# Patient Record
Sex: Male | Born: 1940 | Race: White | Hispanic: No | Marital: Married | Smoking: Never smoker
Health system: Southern US, Community
[De-identification: ages and names within clinical notes are randomized; demographics above are authoritative.]

## PROBLEM LIST (undated history)

## (undated) DIAGNOSIS — T4145XA Adverse effect of unspecified anesthetic, initial encounter: Secondary | ICD-10-CM

## (undated) DIAGNOSIS — G4700 Insomnia, unspecified: Secondary | ICD-10-CM

## (undated) DIAGNOSIS — G4730 Sleep apnea, unspecified: Secondary | ICD-10-CM

## (undated) DIAGNOSIS — R42 Dizziness and giddiness: Secondary | ICD-10-CM

## (undated) DIAGNOSIS — J189 Pneumonia, unspecified organism: Secondary | ICD-10-CM

## (undated) DIAGNOSIS — I639 Cerebral infarction, unspecified: Secondary | ICD-10-CM

## (undated) DIAGNOSIS — IMO0001 Reserved for inherently not codable concepts without codable children: Secondary | ICD-10-CM

## (undated) DIAGNOSIS — H9191 Unspecified hearing loss, right ear: Secondary | ICD-10-CM

## (undated) DIAGNOSIS — M1990 Unspecified osteoarthritis, unspecified site: Secondary | ICD-10-CM

## (undated) DIAGNOSIS — T8859XA Other complications of anesthesia, initial encounter: Secondary | ICD-10-CM

## (undated) DIAGNOSIS — E119 Type 2 diabetes mellitus without complications: Secondary | ICD-10-CM

## (undated) DIAGNOSIS — I10 Essential (primary) hypertension: Secondary | ICD-10-CM

## (undated) DIAGNOSIS — E041 Nontoxic single thyroid nodule: Secondary | ICD-10-CM

## (undated) HISTORY — PX: EYE SURGERY: SHX253

## (undated) HISTORY — PX: NOSE SURGERY: SHX723

## (undated) HISTORY — PX: ROTATOR CUFF REPAIR: SHX139

## (undated) HISTORY — PX: HERNIA REPAIR: SHX51

## (undated) HISTORY — PX: LIPOMA EXCISION: SHX5283

## (undated) HISTORY — PX: COLONOSCOPY: SHX174

## (undated) HISTORY — PX: BASAL CELL CARCINOMA EXCISION: SHX1214

## (undated) HISTORY — PX: MELANOMA EXCISION: SHX5266

---
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
## 1971-01-12 HISTORY — PX: TONSILLECTOMY: SUR1361

## 2002-01-11 DIAGNOSIS — I639 Cerebral infarction, unspecified: Secondary | ICD-10-CM

## 2002-01-11 HISTORY — DX: Cerebral infarction, unspecified: I63.9

## 2006-01-11 HISTORY — PX: CHOLECYSTECTOMY: SHX55

## 2008-01-12 HISTORY — PX: GREEN LIGHT LASER TURP (TRANSURETHRAL RESECTION OF PROSTATE: SHX6260

## 2011-01-12 HISTORY — PX: CARPAL TUNNEL RELEASE: SHX101

## 2015-02-03 ENCOUNTER — Other Ambulatory Visit (HOSPITAL_COMMUNITY): Payer: Self-pay | Admitting: Neurosurgery

## 2015-03-04 ENCOUNTER — Other Ambulatory Visit: Payer: Self-pay | Admitting: Neurosurgery

## 2015-03-05 ENCOUNTER — Other Ambulatory Visit: Payer: Self-pay | Admitting: Neurosurgery

## 2015-03-07 ENCOUNTER — Encounter (HOSPITAL_COMMUNITY)
Admission: RE | Admit: 2015-03-07 | Discharge: 2015-03-07 | Disposition: A | Discharge status: Home / Self Care | Payer: Medicare Other | Source: Ambulatory Visit | Attending: Neurosurgery | Admitting: Neurosurgery

## 2015-03-07 ENCOUNTER — Encounter (HOSPITAL_COMMUNITY): Payer: Self-pay

## 2015-03-07 DIAGNOSIS — Z8673 Personal history of transient ischemic attack (TIA), and cerebral infarction without residual deficits: Secondary | ICD-10-CM | POA: Diagnosis not present

## 2015-03-07 DIAGNOSIS — Z01818 Encounter for other preprocedural examination: Secondary | ICD-10-CM | POA: Diagnosis not present

## 2015-03-07 DIAGNOSIS — G4733 Obstructive sleep apnea (adult) (pediatric): Secondary | ICD-10-CM | POA: Diagnosis not present

## 2015-03-07 DIAGNOSIS — E119 Type 2 diabetes mellitus without complications: Secondary | ICD-10-CM | POA: Diagnosis not present

## 2015-03-07 DIAGNOSIS — R9431 Abnormal electrocardiogram [ECG] [EKG]: Secondary | ICD-10-CM | POA: Insufficient documentation

## 2015-03-07 DIAGNOSIS — M4806 Spinal stenosis, lumbar region: Secondary | ICD-10-CM | POA: Diagnosis not present

## 2015-03-07 DIAGNOSIS — I10 Essential (primary) hypertension: Secondary | ICD-10-CM | POA: Diagnosis not present

## 2015-03-07 DIAGNOSIS — Z79899 Other long term (current) drug therapy: Secondary | ICD-10-CM | POA: Diagnosis not present

## 2015-03-07 DIAGNOSIS — Z01812 Encounter for preprocedural laboratory examination: Secondary | ICD-10-CM | POA: Diagnosis not present

## 2015-03-07 HISTORY — DX: Adverse effect of unspecified anesthetic, initial encounter: T41.45XA

## 2015-03-07 HISTORY — DX: Essential (primary) hypertension: I10

## 2015-03-07 HISTORY — DX: Dizziness and giddiness: R42

## 2015-03-07 HISTORY — DX: Nontoxic single thyroid nodule: E04.1

## 2015-03-07 HISTORY — DX: Pneumonia, unspecified organism: J18.9

## 2015-03-07 HISTORY — DX: Unspecified hearing loss, right ear: H91.91

## 2015-03-07 HISTORY — DX: Type 2 diabetes mellitus without complications: E11.9

## 2015-03-07 HISTORY — DX: Cerebral infarction, unspecified: I63.9

## 2015-03-07 HISTORY — DX: Other complications of anesthesia, initial encounter: T88.59XA

## 2015-03-07 HISTORY — DX: Insomnia, unspecified: G47.00

## 2015-03-07 HISTORY — DX: Sleep apnea, unspecified: G47.30

## 2015-03-07 HISTORY — DX: Reserved for inherently not codable concepts without codable children: IMO0001

## 2015-03-07 HISTORY — DX: Unspecified osteoarthritis, unspecified site: M19.90

## 2015-03-07 LAB — SURGICAL PCR SCREEN
MRSA, PCR: NEGATIVE
Staphylococcus aureus: NEGATIVE

## 2015-03-07 LAB — CBC
HCT: 40.9 % (ref 39.0–52.0)
Hemoglobin: 14.2 g/dL (ref 13.0–17.0)
MCH: 32.1 pg (ref 26.0–34.0)
MCHC: 34.7 g/dL (ref 30.0–36.0)
MCV: 92.5 fL (ref 78.0–100.0)
Platelets: 193 10*3/uL (ref 150–400)
RBC: 4.42 MIL/uL (ref 4.22–5.81)
RDW: 14 % (ref 11.5–15.5)
WBC: 6.5 10*3/uL (ref 4.0–10.5)

## 2015-03-07 LAB — BASIC METABOLIC PANEL
Anion gap: 10 (ref 5–15)
BUN: 14 mg/dL (ref 6–20)
CO2: 27 mmol/L (ref 22–32)
Calcium: 9.8 mg/dL (ref 8.9–10.3)
Chloride: 103 mmol/L (ref 101–111)
Creatinine, Ser: 1.23 mg/dL (ref 0.61–1.24)
GFR calc Af Amer: >60 mL/min (ref >60)
GFR calc non Af Amer: 56 mL/min — ABNORMAL LOW (ref >60)
Glucose, Bld: 215 mg/dL — ABNORMAL HIGH (ref 65–99)
Potassium: 3.6 mmol/L (ref 3.5–5.1)
Sodium: 140 mmol/L (ref 135–145)

## 2015-03-07 NOTE — Progress Notes (Signed)
Nurse called Erie Noe and inquired about the three sets of orders that were in EPIC. Erie Noe stated that Dr. Newell Coral would be doing patients surgery, and to use the latest set of orders that included the Gentamycin in the orders. Nurse thanked Erie Noe for clarification. Call ended.

## 2015-03-07 NOTE — Pre-Procedure Instructions (Signed)
Alan Shields  03/07/2015     Your procedure is scheduled on : Monday March 17, 2015 at 7:30 AM.  Report to [facility] Admitting at 5:30 AM.  Call this number if you have problems the morning of surgery: [phone]    Remember:  Do not eat food or drink liquids after midnight.  Take these medicines the morning of surgery with A SIP OF WATER : Finasteride (Proscar), Hydrocodone if needed, Oxybutynin (Ditropan)   Stop taking any vitamins, herbal medications/supplements, Nabumetone/Relafen, Ibuprofen, Advil, Motrin, Aleve, etc on Monday February 27th   Do not wear jewelry.  Do not wear lotions, powders, or cologne.   Men may shave face and neck.  Do not bring valuables to the hospital.  [facility] is not responsible for any belongings or valuables.  Contacts, dentures or bridgework may not be worn into surgery.  Leave your suitcase in the car.  After surgery it may be brought to your room.  For patients admitted to the hospital, discharge time will be determined by your treatment team.  Patients discharged the day of surgery will not be allowed to drive home.   Name and phone number of your driver:    Special instructions:  Shower using CHG soap the night before and the morning of your surgery  Please read over the following fact sheets that you were given. Pain Booklet, Coughing and Deep Breathing, MRSA Information and Surgical Site Infection Prevention

## 2015-03-07 NOTE — Progress Notes (Signed)
PCP Launa Flight in [place]  Patient has a Development worker, international aid but is unaware of who it is.  Patient informed Nurse that he had a stress test and cardiac cath at [facility]. Will request records  Patient denied having any acute cardiac or pulmonary issues

## 2015-03-10 NOTE — Progress Notes (Addendum)
Anesthesia Chart Review:  Pt is a 75 year old male scheduled for L3-4, L4-5 laminectomy on 03/17/2015 with Dr. Newell Coral.   PCP is Dr. Launa Flight in [place], [place] who is aware of upcoming surgery.   PMH includes:  HTN, DM (diet controlled), stroke (2004), OSA (no CPAP), thyroid nodule. Never smoker. BMI 51.   Medications include: hctz, irbesartan, seroquel  Preoperative labs reviewed.  Glucose 215. HgbA1c from PCP's office was 6.5 on 03/10/15  EKG 03/07/15: Sinus rhythm with 1st degree A-V block. Left axis deviation  Echo 10/10/13 (care everywhere): - Negative bubble study. There is no obvious source of CVA demonstrated by this study, however it cannot be excluded by this study. Suggest TEE if clinically indicated. - Mild mitral regurgitation is present. - Trivial aortic regurgitation is present. - Pulmonary artery systolic pressure estimated from the TR jet is 20 mmHg consistent with mild pulmonary hypertension. - The left atrial size is normal. - The estimated left ventricular ejection fraction is 65 - 70%. - Global left ventricular systolic function is normal. - No pericardial effusion or thickening is seen.  Cardiac cath 04/15/00 (care everywhere): - Left Main: normal  - LAD system: insignificant Left Ventriculogram - LCX system: insignificant  - RCA system: insignificant - No significant CAD indicated. Ejection Fraction: 62%  If no changes, I anticipate pt can proceed with surgery as scheduled.   Rica Mast, FNP-BC [facility]/Anesthesiology Phone: [phone] 03/12/2015 10:34 AM

## 2015-03-12 ENCOUNTER — Other Ambulatory Visit (HOSPITAL_COMMUNITY): Payer: Self-pay

## 2015-03-12 ENCOUNTER — Encounter (HOSPITAL_COMMUNITY): Payer: Self-pay

## 2015-03-16 MED ORDER — VANCOMYCIN HCL 10 G IV SOLR
1500.0000 mg | INTRAVENOUS | Status: AC
  Administered 2015-03-17: 1500 mg via INTRAVENOUS
  Filled 2015-03-16: qty 1500

## 2015-03-16 MED ORDER — GENTAMICIN SULFATE 40 MG/ML IJ SOLN
580.0000 mg | INTRAVENOUS | Status: AC
  Administered 2015-03-17: 580 mg via INTRAVENOUS
  Filled 2015-03-16: qty 14.5

## 2015-03-16 NOTE — Progress Notes (Signed)
Pharmacy Antibiotic Note  IBW = 80 kg AdjBW = 118 kg  Pharmacy consulted to dose gentamicin for surgical prophylaxis.  Baseline labs reviewed.   Plan: - Gentamicin 580mg  IV x 1 - Pharmacy will sign off   Kaneshia Cater D. Laney Potashang, PharmD, BCPS Pager:  [phone] 03/16/2015, 3:20 PM

## 2015-03-16 NOTE — Anesthesia Preprocedure Evaluation (Addendum)
Anesthesia Evaluation  Patient identified by MRN, date of birth, ID band Patient awake    Reviewed: Allergy & Precautions, H&P , Patient's Chart, lab work & pertinent test results, reviewed documented beta blocker date and time   History of Anesthesia Complications (+) AWARENESS UNDER ANESTHESIA and history of anesthetic complications  Airway Mallampati: II  TM Distance: >3 FB Neck ROM: full    Dental no notable dental hx.    Pulmonary sleep apnea ,    Pulmonary exam normal breath sounds clear to auscultation       Cardiovascular hypertension, On Medications  Rhythm:regular Rate:Normal     Neuro/Psych    GI/Hepatic   Endo/Other  diabetes, Type 2Morbid obesity  Renal/GU      Musculoskeletal   Abdominal   Peds  Hematology   Anesthesia Other Findings   Reproductive/Obstetrics                           Anesthesia Physical Anesthesia Plan  ASA: II  Anesthesia Plan: General   Post-op Pain Management:    Induction: Intravenous  Airway Management Planned: Oral ETT and Video Laryngoscope Planned  Additional Equipment:   Intra-op Plan:   Post-operative Plan: Extubation in OR  Informed Consent: I have reviewed the patients History and Physical, chart, labs and discussed the procedure including the risks, benefits and alternatives for the proposed anesthesia with the patient or authorized representative who has indicated his/her understanding and acceptance.   Dental Advisory Given and Dental advisory given  Plan Discussed with: CRNA and Surgeon  Anesthesia Plan Comments: (Post induction...Marland Kitchen.Marland Kitchen.Nasal Airway for post -op recovery  Discussed general anesthesia, including possible nausea, instrumentation of airway, sore throat,pulmonary aspiration, etc. I asked if the were any outstanding questions, or  concerns before we proceeded. )        Anesthesia Quick Evaluation

## 2015-03-17 ENCOUNTER — Inpatient Hospital Stay (HOSPITAL_COMMUNITY): Payer: Medicare Other

## 2015-03-17 ENCOUNTER — Inpatient Hospital Stay (HOSPITAL_COMMUNITY): Payer: Medicare Other | Admitting: Emergency Medicine

## 2015-03-17 ENCOUNTER — Inpatient Hospital Stay (HOSPITAL_COMMUNITY): Payer: Medicare Other | Admitting: Anesthesiology

## 2015-03-17 ENCOUNTER — Encounter (HOSPITAL_COMMUNITY): Payer: Self-pay | Admitting: Certified Registered Nurse Anesthetist

## 2015-03-17 ENCOUNTER — Inpatient Hospital Stay (HOSPITAL_COMMUNITY)
Admission: RE | Admit: 2015-03-17 | Discharge: 2015-03-18 | DRG: 516 | Disposition: A | Discharge status: Home / Self Care | Payer: Medicare Other | Source: Ambulatory Visit | Attending: Neurosurgery | Admitting: Neurosurgery

## 2015-03-17 ENCOUNTER — Encounter (HOSPITAL_COMMUNITY)
Admission: RE | Disposition: A | Discharge status: Home / Self Care | Payer: Self-pay | Source: Ambulatory Visit | Attending: Neurosurgery

## 2015-03-17 DIAGNOSIS — Z8673 Personal history of transient ischemic attack (TIA), and cerebral infarction without residual deficits: Secondary | ICD-10-CM

## 2015-03-17 DIAGNOSIS — Z880 Allergy status to penicillin: Secondary | ICD-10-CM | POA: Diagnosis not present

## 2015-03-17 DIAGNOSIS — Z6841 Body Mass Index (BMI) 40.0 and over, adult: Secondary | ICD-10-CM

## 2015-03-17 DIAGNOSIS — Z888 Allergy status to other drugs, medicaments and biological substances status: Secondary | ICD-10-CM

## 2015-03-17 DIAGNOSIS — Z91048 Other nonmedicinal substance allergy status: Secondary | ICD-10-CM | POA: Diagnosis not present

## 2015-03-17 DIAGNOSIS — Z791 Long term (current) use of non-steroidal anti-inflammatories (NSAID): Secondary | ICD-10-CM

## 2015-03-17 DIAGNOSIS — G4730 Sleep apnea, unspecified: Secondary | ICD-10-CM | POA: Diagnosis present

## 2015-03-17 DIAGNOSIS — E119 Type 2 diabetes mellitus without complications: Secondary | ICD-10-CM | POA: Diagnosis present

## 2015-03-17 DIAGNOSIS — I10 Essential (primary) hypertension: Secondary | ICD-10-CM | POA: Diagnosis present

## 2015-03-17 DIAGNOSIS — Z882 Allergy status to sulfonamides status: Secondary | ICD-10-CM | POA: Diagnosis not present

## 2015-03-17 DIAGNOSIS — M4806 Spinal stenosis, lumbar region: Secondary | ICD-10-CM | POA: Diagnosis present

## 2015-03-17 DIAGNOSIS — H9191 Unspecified hearing loss, right ear: Secondary | ICD-10-CM | POA: Diagnosis present

## 2015-03-17 DIAGNOSIS — Z91013 Allergy to seafood: Secondary | ICD-10-CM

## 2015-03-17 DIAGNOSIS — Z881 Allergy status to other antibiotic agents status: Secondary | ICD-10-CM | POA: Diagnosis not present

## 2015-03-17 DIAGNOSIS — M48062 Spinal stenosis, lumbar region with neurogenic claudication: Secondary | ICD-10-CM | POA: Diagnosis present

## 2015-03-17 DIAGNOSIS — M47816 Spondylosis without myelopathy or radiculopathy, lumbar region: Secondary | ICD-10-CM | POA: Diagnosis present

## 2015-03-17 DIAGNOSIS — Z419 Encounter for procedure for purposes other than remedying health state, unspecified: Secondary | ICD-10-CM

## 2015-03-17 DIAGNOSIS — G4700 Insomnia, unspecified: Secondary | ICD-10-CM | POA: Diagnosis present

## 2015-03-17 DIAGNOSIS — Z91041 Radiographic dye allergy status: Secondary | ICD-10-CM | POA: Diagnosis not present

## 2015-03-17 HISTORY — PX: LUMBAR LAMINECTOMY WITH COFLEX 2 LEVEL: SHX6515

## 2015-03-17 LAB — GLUCOSE, CAPILLARY
Glucose-Capillary: 158 mg/dL — ABNORMAL HIGH (ref 65–99)
Glucose-Capillary: 133 mg/dL — ABNORMAL HIGH (ref 65–99)
Glucose-Capillary: 137 mg/dL — ABNORMAL HIGH (ref 65–99)
Glucose-Capillary: 145 mg/dL — ABNORMAL HIGH (ref 65–99)
Glucose-Capillary: 119 mg/dL — ABNORMAL HIGH (ref 65–99)

## 2015-03-17 SURGERY — LUMBAR LAMINECTOMY WITH COFLEX 2 LEVEL
Anesthesia: General | Site: Back | Laterality: Bilateral

## 2015-03-17 MED ORDER — SUGAMMADEX SODIUM 200 MG/2ML IV SOLN
INTRAVENOUS | Status: AC
  Filled 2015-03-17: qty 4

## 2015-03-17 MED ORDER — SODIUM CHLORIDE 0.9 % IR SOLN
Status: DC | PRN
  Administered 2015-03-17: 09:00:00

## 2015-03-17 MED ORDER — THROMBIN 5000 UNITS EX SOLR
OROMUCOSAL | Status: DC | PRN
  Administered 2015-03-17: 09:00:00 via TOPICAL

## 2015-03-17 MED ORDER — ONDANSETRON HCL 4 MG PO TABS: 4.0000 mg | ORAL_TABLET | Freq: Four times a day (QID) | ORAL | Status: DC | PRN

## 2015-03-17 MED ORDER — HYDROXYZINE HCL 50 MG/ML IM SOLN: 50.0000 mg | INTRAMUSCULAR | Status: DC | PRN

## 2015-03-17 MED ORDER — DEXAMETHASONE SODIUM PHOSPHATE 10 MG/ML IJ SOLN: 10.0000 mg | INTRAMUSCULAR | Status: DC

## 2015-03-17 MED ORDER — PROPOFOL 10 MG/ML IV BOLUS
INTRAVENOUS | Status: DC | PRN
Start: 2015-03-17 — End: 2015-03-17
  Administered 2015-03-17: 200 mg via INTRAVENOUS

## 2015-03-17 MED ORDER — ACETAMINOPHEN 650 MG RE SUPP: 650.0000 mg | RECTAL | Status: DC | PRN

## 2015-03-17 MED ORDER — PHENYLEPHRINE 40 MCG/ML (10ML) SYRINGE FOR IV PUSH (FOR BLOOD PRESSURE SUPPORT)
PREFILLED_SYRINGE | INTRAVENOUS | Status: AC
  Filled 2015-03-17: qty 10

## 2015-03-17 MED ORDER — SURGIFOAM 100 EX MISC
CUTANEOUS | Status: DC | PRN
  Administered 2015-03-17: 09:00:00 via TOPICAL

## 2015-03-17 MED ORDER — PROPOFOL 10 MG/ML IV BOLUS
INTRAVENOUS | Status: AC
  Filled 2015-03-17: qty 20

## 2015-03-17 MED ORDER — QUETIAPINE FUMARATE 50 MG PO TABS
50.0000 mg | ORAL_TABLET | Freq: Every day | ORAL | Status: DC
  Administered 2015-03-17: 50 mg via ORAL
  Filled 2015-03-17 (×2): qty 1

## 2015-03-17 MED ORDER — PHENYLEPHRINE 40 MCG/ML (10ML) SYRINGE FOR IV PUSH (FOR BLOOD PRESSURE SUPPORT)
PREFILLED_SYRINGE | INTRAVENOUS | Status: AC
Start: 2015-03-17 — End: 2015-03-17
  Filled 2015-03-17: qty 10

## 2015-03-17 MED ORDER — SODIUM CHLORIDE 0.9% FLUSH
3.0000 mL | Freq: Two times a day (BID) | INTRAVENOUS | Status: DC
Start: 2015-03-17 — End: 2015-03-18
  Administered 2015-03-17 (×2): 3 mL via INTRAVENOUS

## 2015-03-17 MED ORDER — BISACODYL 10 MG RE SUPP: 10.0000 mg | Freq: Every day | RECTAL | Status: DC | PRN

## 2015-03-17 MED ORDER — KETOROLAC TROMETHAMINE 15 MG/ML IJ SOLN
INTRAMUSCULAR | Status: AC
  Administered 2015-03-17: 15 mg
  Filled 2015-03-17: qty 1

## 2015-03-17 MED ORDER — ROCURONIUM BROMIDE 50 MG/5ML IV SOLN
INTRAVENOUS | Status: AC
  Filled 2015-03-17: qty 2

## 2015-03-17 MED ORDER — ONDANSETRON HCL 4 MG/2ML IJ SOLN
INTRAMUSCULAR | Status: DC | PRN
  Administered 2015-03-17: 4 mg via INTRAVENOUS

## 2015-03-17 MED ORDER — OXYBUTYNIN CHLORIDE ER 10 MG PO TB24
10.0000 mg | ORAL_TABLET | Freq: Every day | ORAL | Status: DC
  Administered 2015-03-18: 10 mg via ORAL
  Filled 2015-03-17: qty 1

## 2015-03-17 MED ORDER — KETOROLAC TROMETHAMINE 30 MG/ML IJ SOLN
15.0000 mg | Freq: Four times a day (QID) | INTRAMUSCULAR | Status: DC
  Administered 2015-03-17 – 2015-03-18 (×3): 15 mg via INTRAVENOUS
  Filled 2015-03-17 (×2): qty 1

## 2015-03-17 MED ORDER — MORPHINE SULFATE (PF) 4 MG/ML IV SOLN
4.0000 mg | INTRAVENOUS | Status: DC | PRN
  Administered 2015-03-17: 4 mg via INTRAMUSCULAR
  Filled 2015-03-17: qty 1

## 2015-03-17 MED ORDER — FENTANYL CITRATE (PF) 100 MCG/2ML IJ SOLN
INTRAMUSCULAR | Status: AC
  Filled 2015-03-17: qty 2

## 2015-03-17 MED ORDER — ACETAMINOPHEN 160 MG/5ML PO SOLN
650.0000 mg | Freq: Once | ORAL | Status: DC
  Filled 2015-03-17: qty 20.3

## 2015-03-17 MED ORDER — POTASSIUM CHLORIDE IN NACL 20-0.9 MEQ/L-% IV SOLN
INTRAVENOUS | Status: DC
  Filled 2015-03-17 (×5): qty 1000

## 2015-03-17 MED ORDER — HYDROCHLOROTHIAZIDE 50 MG PO TABS
50.0000 mg | ORAL_TABLET | Freq: Every day | ORAL | Status: DC
  Administered 2015-03-18: 50 mg via ORAL
  Filled 2015-03-17: qty 1

## 2015-03-17 MED ORDER — LIDOCAINE HCL (CARDIAC) 20 MG/ML IV SOLN
INTRAVENOUS | Status: AC
  Filled 2015-03-17: qty 5

## 2015-03-17 MED ORDER — IRBESARTAN 150 MG PO TABS
150.0000 mg | ORAL_TABLET | Freq: Every day | ORAL | Status: DC
  Administered 2015-03-17 – 2015-03-18 (×2): 150 mg via ORAL
  Filled 2015-03-17 (×2): qty 1

## 2015-03-17 MED ORDER — EPHEDRINE SULFATE 50 MG/ML IJ SOLN
INTRAMUSCULAR | Status: AC
  Filled 2015-03-17: qty 1

## 2015-03-17 MED ORDER — FENTANYL CITRATE (PF) 250 MCG/5ML IJ SOLN
INTRAMUSCULAR | Status: AC
  Filled 2015-03-17: qty 5

## 2015-03-17 MED ORDER — MENTHOL 3 MG MT LOZG: 1.0000 | LOZENGE | OROMUCOSAL | Status: DC | PRN

## 2015-03-17 MED ORDER — HYDROXYZINE HCL 25 MG PO TABS: 50.0000 mg | ORAL_TABLET | ORAL | Status: DC | PRN

## 2015-03-17 MED ORDER — SODIUM CHLORIDE 0.9% FLUSH: 3.0000 mL | INTRAVENOUS | Status: DC | PRN

## 2015-03-17 MED ORDER — FINASTERIDE 5 MG PO TABS
5.0000 mg | ORAL_TABLET | Freq: Every day | ORAL | Status: DC
  Administered 2015-03-17 – 2015-03-18 (×2): 5 mg via ORAL
  Filled 2015-03-17 (×2): qty 1

## 2015-03-17 MED ORDER — CYCLOBENZAPRINE HCL 10 MG PO TABS
10.0000 mg | ORAL_TABLET | Freq: Three times a day (TID) | ORAL | Status: DC | PRN
  Administered 2015-03-17 – 2015-03-18 (×2): 10 mg via ORAL
  Filled 2015-03-17 (×2): qty 1

## 2015-03-17 MED ORDER — LIDOCAINE-EPINEPHRINE 1 %-1:100000 IJ SOLN
INTRAMUSCULAR | Status: DC | PRN
  Administered 2015-03-17: 30 mL

## 2015-03-17 MED ORDER — SUCCINYLCHOLINE CHLORIDE 20 MG/ML IJ SOLN
INTRAMUSCULAR | Status: AC
  Filled 2015-03-17: qty 1

## 2015-03-17 MED ORDER — PHENYLEPHRINE HCL 10 MG/ML IJ SOLN
INTRAMUSCULAR | Status: DC | PRN
  Administered 2015-03-17: 40 ug via INTRAVENOUS

## 2015-03-17 MED ORDER — OXYCODONE-ACETAMINOPHEN 5-325 MG PO TABS
1.0000 | ORAL_TABLET | ORAL | Status: DC | PRN
  Administered 2015-03-18 (×2): 2 via ORAL
  Filled 2015-03-17 (×2): qty 2

## 2015-03-17 MED ORDER — ROCURONIUM BROMIDE 100 MG/10ML IV SOLN
INTRAVENOUS | Status: DC | PRN
  Administered 2015-03-17 (×2): 10 mg via INTRAVENOUS
  Administered 2015-03-17: 50 mg via INTRAVENOUS
  Administered 2015-03-17: 10 mg via INTRAVENOUS

## 2015-03-17 MED ORDER — 0.9 % SODIUM CHLORIDE (POUR BTL) OPTIME
TOPICAL | Status: DC | PRN
  Administered 2015-03-17 (×2): 1000 mL

## 2015-03-17 MED ORDER — ACETAMINOPHEN 10 MG/ML IV SOLN
INTRAVENOUS | Status: AC
  Administered 2015-03-17: 1000 mg via INTRAVENOUS
  Filled 2015-03-17: qty 100

## 2015-03-17 MED ORDER — ZOLPIDEM TARTRATE 5 MG PO TABS: 5.0000 mg | ORAL_TABLET | Freq: Every evening | ORAL | Status: DC | PRN

## 2015-03-17 MED ORDER — VITAMIN D3 25 MCG (1000 UNIT) PO TABS
5000.0000 [IU] | ORAL_TABLET | Freq: Every day | ORAL | Status: DC
  Administered 2015-03-18: 5000 [IU] via ORAL
  Filled 2015-03-17: qty 5

## 2015-03-17 MED ORDER — ALUM & MAG HYDROXIDE-SIMETH 200-200-20 MG/5ML PO SUSP: 30.0000 mL | Freq: Four times a day (QID) | ORAL | Status: DC | PRN

## 2015-03-17 MED ORDER — SUGAMMADEX SODIUM 500 MG/5ML IV SOLN
INTRAVENOUS | Status: AC
  Filled 2015-03-17: qty 5

## 2015-03-17 MED ORDER — ONDANSETRON HCL 4 MG/2ML IJ SOLN
INTRAMUSCULAR | Status: AC
  Filled 2015-03-17: qty 2

## 2015-03-17 MED ORDER — STERILE WATER FOR INJECTION IJ SOLN
INTRAMUSCULAR | Status: AC
  Filled 2015-03-17: qty 10

## 2015-03-17 MED ORDER — GLYCOPYRROLATE 0.2 MG/ML IJ SOLN
INTRAMUSCULAR | Status: AC
Start: 2015-03-17 — End: 2015-03-17
  Filled 2015-03-17: qty 3

## 2015-03-17 MED ORDER — BUPIVACAINE HCL (PF) 0.5 % IJ SOLN
INTRAMUSCULAR | Status: DC | PRN
  Administered 2015-03-17: 30 mL

## 2015-03-17 MED ORDER — SUGAMMADEX SODIUM 200 MG/2ML IV SOLN
INTRAVENOUS | Status: DC | PRN
  Administered 2015-03-17: 350 mg via INTRAVENOUS

## 2015-03-17 MED ORDER — FENTANYL CITRATE (PF) 100 MCG/2ML IJ SOLN
INTRAMUSCULAR | Status: DC | PRN
  Administered 2015-03-17 (×2): 100 ug via INTRAVENOUS
  Administered 2015-03-17 (×2): 50 ug via INTRAVENOUS

## 2015-03-17 MED ORDER — ROCURONIUM BROMIDE 50 MG/5ML IV SOLN
INTRAVENOUS | Status: AC
  Filled 2015-03-17: qty 1

## 2015-03-17 MED ORDER — PHENOL 1.4 % MT LIQD: 1.0000 | OROMUCOSAL | Status: DC | PRN

## 2015-03-17 MED ORDER — HYDROCODONE-ACETAMINOPHEN 5-325 MG PO TABS
1.0000 | ORAL_TABLET | ORAL | Status: DC | PRN
  Administered 2015-03-17: 2 via ORAL
  Filled 2015-03-17: qty 2

## 2015-03-17 MED ORDER — FENTANYL CITRATE (PF) 100 MCG/2ML IJ SOLN
25.0000 ug | INTRAMUSCULAR | Status: DC | PRN
  Administered 2015-03-17 (×2): 50 ug via INTRAVENOUS

## 2015-03-17 MED ORDER — NEOSTIGMINE METHYLSULFATE 10 MG/10ML IV SOLN
INTRAVENOUS | Status: AC
  Filled 2015-03-17: qty 1

## 2015-03-17 MED ORDER — LACTATED RINGERS IV SOLN
INTRAVENOUS | Status: DC | PRN
  Administered 2015-03-17 (×2): via INTRAVENOUS

## 2015-03-17 MED ORDER — ACETAMINOPHEN 325 MG PO TABS: 650.0000 mg | ORAL_TABLET | ORAL | Status: DC | PRN

## 2015-03-17 MED ORDER — SUCCINYLCHOLINE CHLORIDE 20 MG/ML IJ SOLN
INTRAMUSCULAR | Status: DC | PRN
  Administered 2015-03-17: 180 mg via INTRAVENOUS

## 2015-03-17 MED ORDER — ONDANSETRON HCL 4 MG/2ML IJ SOLN: 4.0000 mg | Freq: Four times a day (QID) | INTRAMUSCULAR | Status: DC | PRN

## 2015-03-17 MED ORDER — KETOROLAC TROMETHAMINE 30 MG/ML IJ SOLN: 15.0000 mg | Freq: Once | INTRAMUSCULAR | Status: DC

## 2015-03-17 MED ORDER — DEXAMETHASONE SODIUM PHOSPHATE 10 MG/ML IJ SOLN
INTRAMUSCULAR | Status: AC
  Filled 2015-03-17: qty 1

## 2015-03-17 MED ORDER — LIDOCAINE HCL (CARDIAC) 20 MG/ML IV SOLN
INTRAVENOUS | Status: DC | PRN
  Administered 2015-03-17: 10 mg via INTRAVENOUS

## 2015-03-17 MED ORDER — MAGNESIUM HYDROXIDE 400 MG/5ML PO SUSP: 30.0000 mL | Freq: Every day | ORAL | Status: DC | PRN

## 2015-03-17 SURGICAL SUPPLY — 63 items
BAG DECANTER FOR FLEXI CONT (MISCELLANEOUS) ×2 IMPLANT
BENZOIN TINCTURE PRP APPL 2/3 (GAUZE/BANDAGES/DRESSINGS) IMPLANT
BLADE CLIPPER SURG (BLADE) IMPLANT
BRUSH SCRUB EZ PLAIN DRY (MISCELLANEOUS) ×2 IMPLANT
BUR ACORN 6.0 ACORN (BURR) IMPLANT
BUR ACRON 5.0MM COATED (BURR) ×2 IMPLANT
BUR MATCHSTICK NEURO 3.0 LAGG (BURR) ×2 IMPLANT
CANISTER SUCT 3000ML PPV (MISCELLANEOUS) ×2 IMPLANT
DERMABOND ADVANCED (GAUZE/BANDAGES/DRESSINGS) ×2
DERMABOND ADVANCED .7 DNX12 (GAUZE/BANDAGES/DRESSINGS) ×2 IMPLANT
DEVICE COFLEX STABLIZATION 8MM (Neuro Prosthesis/Implant) ×4 IMPLANT
DRAPE C-ARM 42X72 X-RAY (DRAPES) ×4 IMPLANT
DRAPE LAPAROTOMY 100X72X124 (DRAPES) ×2 IMPLANT
DRAPE MICROSCOPE LEICA (MISCELLANEOUS) ×2 IMPLANT
DRAPE POUCH INSTRU U-SHP 10X18 (DRAPES) ×2 IMPLANT
DRSG EMULSION OIL 3X3 NADH (GAUZE/BANDAGES/DRESSINGS) IMPLANT
ELECT REM PT RETURN 9FT ADLT (ELECTROSURGICAL) ×2
ELECTRODE REM PT RTRN 9FT ADLT (ELECTROSURGICAL) ×1 IMPLANT
GAUZE SPONGE 4X4 12PLY STRL (GAUZE/BANDAGES/DRESSINGS) ×2 IMPLANT
GAUZE SPONGE 4X4 16PLY XRAY LF (GAUZE/BANDAGES/DRESSINGS) IMPLANT
GLOVE BIOGEL PI IND STRL 7.5 (GLOVE) ×3 IMPLANT
GLOVE BIOGEL PI IND STRL 8 (GLOVE) ×1 IMPLANT
GLOVE BIOGEL PI IND STRL 8.5 (GLOVE) ×1 IMPLANT
GLOVE BIOGEL PI INDICATOR 7.5 (GLOVE) ×3
GLOVE BIOGEL PI INDICATOR 8 (GLOVE) ×1
GLOVE BIOGEL PI INDICATOR 8.5 (GLOVE) ×1
GLOVE ECLIPSE 7.5 STRL STRAW (GLOVE) ×2 IMPLANT
GLOVE ECLIPSE 8.5 STRL (GLOVE) ×2 IMPLANT
GLOVE EXAM NITRILE LRG STRL (GLOVE) IMPLANT
GLOVE EXAM NITRILE MD LF STRL (GLOVE) IMPLANT
GLOVE EXAM NITRILE XL STR (GLOVE) IMPLANT
GLOVE EXAM NITRILE XS STR PU (GLOVE) IMPLANT
GLOVE SURG SS PI 7.0 STRL IVOR (GLOVE) ×4 IMPLANT
GOWN STRL REUS W/ TWL LRG LVL3 (GOWN DISPOSABLE) ×1 IMPLANT
GOWN STRL REUS W/ TWL XL LVL3 (GOWN DISPOSABLE) IMPLANT
GOWN STRL REUS W/TWL 2XL LVL3 (GOWN DISPOSABLE) IMPLANT
GOWN STRL REUS W/TWL LRG LVL3 (GOWN DISPOSABLE) ×1
GOWN STRL REUS W/TWL XL LVL3 (GOWN DISPOSABLE)
HEMOSTAT POWDER SURGIFOAM 1G (HEMOSTASIS) ×2 IMPLANT
KIT BASIN OR (CUSTOM PROCEDURE TRAY) ×2 IMPLANT
KIT ROOM TURNOVER OR (KITS) ×2 IMPLANT
NEEDLE HYPO 18GX1.5 BLUNT FILL (NEEDLE) IMPLANT
NEEDLE SPNL 18GX3.5 QUINCKE PK (NEEDLE) ×4 IMPLANT
NEEDLE SPNL 22GX3.5 QUINCKE BK (NEEDLE) IMPLANT
NS IRRIG 1000ML POUR BTL (IV SOLUTION) ×4 IMPLANT
PACK LAMINECTOMY NEURO (CUSTOM PROCEDURE TRAY) ×2 IMPLANT
PAD ARMBOARD 7.5X6 YLW CONV (MISCELLANEOUS) ×6 IMPLANT
PATTIES SURGICAL .5 X1 (DISPOSABLE) ×2 IMPLANT
RUBBERBAND STERILE (MISCELLANEOUS) ×4 IMPLANT
SPONGE LAP 4X18 X RAY DECT (DISPOSABLE) ×2 IMPLANT
SPONGE SURGIFOAM ABS GEL 100 (HEMOSTASIS) ×2 IMPLANT
STRIP CLOSURE SKIN 1/2X4 (GAUZE/BANDAGES/DRESSINGS) IMPLANT
SUT PROLENE 6 0 BV (SUTURE) IMPLANT
SUT VIC AB 1 CT1 18XBRD ANBCTR (SUTURE) ×2 IMPLANT
SUT VIC AB 1 CT1 8-18 (SUTURE) ×2
SUT VIC AB 2-0 CP2 18 (SUTURE) ×4 IMPLANT
SUT VIC AB 3-0 SH 8-18 (SUTURE) IMPLANT
SYR 5ML LL (SYRINGE) IMPLANT
TAPE PAPER 2X10 WHT MICROPORE (GAUZE/BANDAGES/DRESSINGS) ×2 IMPLANT
TOWEL OR 17X24 6PK STRL BLUE (TOWEL DISPOSABLE) ×2 IMPLANT
TOWEL OR 17X26 10 PK STRL BLUE (TOWEL DISPOSABLE) ×2 IMPLANT
TRAY FOLEY W/METER SILVER 16FR (SET/KITS/TRAYS/PACK) ×2 IMPLANT
WATER STERILE IRR 1000ML POUR (IV SOLUTION) ×2 IMPLANT

## 2015-03-17 NOTE — Op Note (Signed)
03/17/2015  11:26 AM  PATIENT:  Alan Shields  75 y.o. 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  ANESTHESIA:   general  EBL:  Total I/O In: 1000 [I.V.:1000] Out: 320 [Urine:220; Blood:100]  BLOOD ADMINISTERED:none  COUNT:  Correct per nursing staff  DICTATION: Patient brought the operating room, placed under general endotracheal anesthesia. Patient was turned to a prone position. The lumbar region was prepped with Betadine soap and solution draped in sterile fashion. A localizing x-ray was taken and the L3-L5 levels were identified. Midline was infiltrated with local anesthetic with epinephrine, and a midline incision made, carried down through the subcutaneous tissue. Bipolar cautery and electrocautery used to maintain hemostasis. The lumbar fascia was incised on each side of the midline, and the paraspinal musculature was dissected from the spinous processes and lamina in a subperiosteal fashion. A self retaining retractor was placed, and another x-ray was taken and the L3-4 and L4-5 interlaminar spaces were identified. The spinous processes were cleaned of soft tissue, and then the interspinous space was cleaned of soft tissue, and the interspinous space was widened removing the inferior aspect of the superior  spinous process and the superior aspect of the inferior spinous process using the high-speed drill and double-action rongeurs. We used an 8 mm interspinous sizer to guide this portion of the laminectomy. Laminectomy was also performed laterally at each level, including an inferior L3 and superior L4 laminotomies at the L3-4 level, along with a bilateral medial L3-4 facetectomy, and a inferior L4 and superior L5 laminotomies at the L4-5 level, along with a bilateral medial L4-5 facetectomy.  As the decompression was performed, thickened and partially calcified ligament of flavum was carefully removed, decompressing the stenotic compression of the spinal canal and thecal sac. This decompression was extended laterally so as to decompress the stenotic compression of the exiting L3, L4, and L5 nerve roots bilaterally. Once the decompression was completed, hemostasis was established with use of bone wax, Gelfoam with thrombin, and Surgifoam. We again measured the interspinous spaces and selected 8 mm height Coflex implants. The spinous process were cleaned of soft tissue, and then the sizers for the wings were used to confirm the preparation of the spinous processes.  We then gently tamped in the first 8 mm implant at the L4-5 level, with C-arm fluoroscopic guidance. About 3 mm of space was found between the ventral aspect of the implant and the dorsal aspect the thecal sac. The wings were then tightened down against each of the spinous processes. We then gently tamped the second 8 mm implant at the L3-4 level, again with C-arm fluoroscopic guidance. Again about 3 mm of space was found between the ventral aspect of the implant and the dorsal aspect of the thecal sac. The wings again were tightened down against each of the spinous processes. Final AP and lateral C-arm fluoroscopic images were obtained. The wound was irrigated throughout the procedure with saline solution, and at the completion of the procedure with  bacitracin solution. Hemostasis was confirmed, we proceeded with closure. Deep fascia was closed with interrupted undyed 1 Vicryl sutures. Scarpa's fascia was closed interrupted undyed 1 Vicryl sutures. Second cutaneous and subcuticular layer were closed with interrupted inverted 2-0 Vicryl sutures. Skin edges were approximated with Dermabond. We'll was dressed with sterile gauze and Hypafix. Following surgery the patient was turned back to supine position, to be reversed and the anesthetic, extubated, and transferred to the recovery room for further care.   PLAN OF CARE: Admit to inpatient   PATIENT DISPOSITION:  PACU - hemodynamically stable.   Delay start of Pharmacological VTE agent (>24hrs) due to surgical blood loss or risk of bleeding:  yes

## 2015-03-17 NOTE — Anesthesia Postprocedure Evaluation (Signed)
Anesthesia Post Note  Patient: Alan Shields  Procedure(s) Performed: Procedure(s) (LRB): Laminectomy and Foraminotomy - Lumbar three-Lumbar four - Lumbar four-Lumbar five - bilateral with CoFlex (Bilateral)  Patient location during evaluation: PACU Anesthesia Type: General Level of consciousness: awake and alert Pain management: pain level controlled Vital Signs Assessment: post-procedure vital signs reviewed and stable Respiratory status: spontaneous breathing, nonlabored ventilation and respiratory function stable Cardiovascular status: blood pressure returned to baseline and stable Postop Assessment: no signs of nausea or vomiting Anesthetic complications: no    Last Vitals:  Filed Vitals:   03/17/15 1317 03/17/15 1601  BP: 138/70 139/70  Pulse: 71 78  Temp: 36.8 C 36.6 C  Resp: 18 18    Last Pain:  Filed Vitals:   03/17/15 1601  PainSc: 2                  Chelsye Suhre A

## 2015-03-17 NOTE — Progress Notes (Signed)
Filed Vitals:   03/17/15 1245 03/17/15 1300 03/17/15 1317 03/17/15 1601  BP:   138/70 139/70  Pulse: 59 46 71 78  Temp:   98.3 F (36.8 C) 97.8 F (36.6 C)  TempSrc:      Resp: 32 23 18 18   Height:      Weight:      SpO2: 93% 96% 100% 100%    Patient resting in bed, has ambulate in the halls. Foley DC'd in PACU, has voided. Dressing clean and dry. Patient notes excellent relief of his neurogenic claudication.  Plan: Encouraged to ambulate again this evening and again in the morning. We'll continue to progress through postoperative recovery.  [name],ROBERT W, MD 03/17/2015, 7:22 PM

## 2015-03-17 NOTE — Anesthesia Procedure Notes (Signed)
Procedure Name: Intubation Date/Time: 03/17/2015 7:48 AM Performed by: Adonis HousekeeperNGELL, Remi Lopata M Pre-anesthesia Checklist: Patient identified, Emergency Drugs available, Suction available and Patient being monitored Patient Re-evaluated:Patient Re-evaluated prior to inductionOxygen Delivery Method: Circle system utilized Preoxygenation: Pre-oxygenation with 100% oxygen Intubation Type: IV induction Ventilation: Mask ventilation without difficulty Laryngoscope Size: Mac and 4 Grade View: Grade I Tube type: Oral Tube size: 7.5 mm Number of attempts: 1 Airway Equipment and Method: Stylet Placement Confirmation: ETT inserted through vocal cords under direct vision,  positive ETCO2 and breath sounds checked- equal and bilateral Secured at: 24 cm Tube secured with: Tape Dental Injury: Teeth and Oropharynx as per pre-operative assessment

## 2015-03-17 NOTE — H&P (Signed)
Subjective: Patient is a 75 y.o. handed white male who is admitted for treatment of multilevel multifactorial lumbar stenosis with neurogenic claudication.  Patient has been having difficulties for the past year with progressively worsening pain extending from his buttocks down to the posterior thighs and calves bilaterally. His lower extremity can give way he's undergone spinal injections which have given him a brief period of relief for couple of weeks, but no lasting relief. He explains the pain is constant, but worse with standing and walking, but it hurts even when sitting and laying. It is severely aggravated by coughing. He is limited in walking no more than 150-200 yards at most, and finds it does become somewhat short of breath because of the pain. He is admitted now for a lumbar laminectomy and medial facetectomy for decompression at the L3-4 and L4-5 levels, with implantation of Coflex intraspinous device at the L3-4 and L4-5 levels.    Past Medical History  Diagnosis Date  . Complication of anesthesia     Pt stated "it takes alot to put me to sleep, and he woke up during melanoma excision surgery"  . Hypertension   . Shortness of breath dyspnea     From "pain in my back"  . Pneumonia     hx of  . Stroke [facility]) 2004  . Thyroid nodule   . Arthritis   . Insomnia     Takes Seroquel  . Vertigo   . Deafness in right ear   . Sleep apnea     does not wear machine at night  . Diabetes mellitus without complication (HCC)     diet controlled as of 03/10/15    Past Surgical History  Procedure Laterality Date  . Shoulder surgery Right 1961  . Tonsillectomy  1973  . Lipoma excision      X 10  . Rotator cuff repair Right   . Cholecystectomy  2008  . Carpal tunnel release Left 2013  . Green light laser turp (transurethral resection of prostate  2010  . Colonoscopy    . Melanoma excision Bilateral     Right temple; right upper arm  . Basal cell carcinoma excision      several  . Nose  surgery    . Hernia repair      umbilical  . Eye surgery Bilateral     cataract removal    Prescriptions prior to admission  Medication Sig Dispense Refill Last Dose  . cholecalciferol (VITAMIN D) 1000 units tablet Take 5,000 Units by mouth daily.   03/16/2015 at Unknown time  . finasteride (PROSCAR) 5 MG tablet Take 5 mg by mouth daily.   03/16/2015 at Unknown time  . hydrochlorothiazide (HYDRODIURIL) 50 MG tablet Take 50 mg by mouth daily.   03/16/2015 at Unknown time  . HYDROcodone-acetaminophen (NORCO) 7.5-325 MG tablet Take 1 tablet by mouth every 4 (four) hours as needed for moderate pain.   0 03/16/2015 at Unknown time  . irbesartan (AVAPRO) 150 MG tablet Take 150 mg by mouth daily.   03/16/2015 at Unknown time  . nabumetone (RELAFEN) 500 MG tablet Take 500 mg by mouth 2 (two) times daily.   Past Month at Unknown time  . oxybutynin (DITROPAN-XL) 10 MG 24 hr tablet Take 10 mg by mouth daily.   03/17/2015 at 0400  . QUEtiapine (SEROQUEL) 50 MG tablet Take 50 mg by mouth at bedtime.   03/16/2015 at Unknown time   Allergies  Allergen Reactions  . Contrast Media [Iodinated  Diagnostic Agents] Anaphylaxis and Hives    Throat swelling, pre-med did NOT work  . Iodides Anaphylaxis and Other (See Comments)    IV IODINE CONTRAST DYE,Betadine OK, per pt  Breakthrough itching and feeling like his throat was "closing up" with premed for myelogram on 12/24/14.   Marland Kitchen Penicillins Anaphylaxis, Hives and Itching    Has patient had a PCN reaction causing immediate rash, facial/tongue/throat swelling, SOB or lightheadedness with hypotension: Yes Has patient had a PCN reaction causing severe rash involving mucus membranes or skin necrosis: No Has patient had a PCN reaction that required hospitalization No Has patient had a PCN reaction occurring within the last 10 years: No If all of the above answers are "NO", then may proceed with Cephalosporin use.   Marland Kitchen Shellfish-Derived Products Anaphylaxis and Hives     SHRIMP Lobster and crab are OK  . Sulfa Antibiotics Other (See Comments)    unknown  . Ciprofloxacin Itching  . Prednisone Itching and Other (See Comments)    Itching Also describes "INTERNAL ITCHING"  . Tape Itching and Rash    Redness, Tears skin off, Please use "paper" tape    Social History  Substance Use Topics  . Smoking status: Never Smoker   . Smokeless tobacco: Not on file  . Alcohol Use: Yes     Comment: monthly    History reviewed. No pertinent family history.   Review of Systems A comprehensive review of systems was negative.  Objective: Vital signs in last 24 hours: Temp:  [98.6 F (37 C)] 98.6 F (37 C) (03/06 0650) Pulse Rate:  [81] 81 (03/06 0650) Resp:  [18] 18 (03/06 0650) BP: (145)/(78) 145/78 mmHg (03/06 0650) SpO2:  [97 %] 97 % (03/06 0650) Weight:  [174.3 kg (384 lb 4.2 oz)] 174.3 kg (384 lb 4.2 oz) (03/06 0650)  EXAM: Patient is a morbidly obese white male in discomfort, but no acute distress. Lungs are clear to auscultation , the patient has symmetrical respiratory excursion. Heart has a regular rate and rhythm normal S1 and S2 no murmur.   Abdomen is soft nontender nondistended bowel sounds are present. Extremity examination shows no clubbing cyanosis or edema. Motor examination shows 5 over 5 strength in the lower extremities including the iliopsoas quadriceps dorsiflexor extensor hallicus  longus and plantar flexor bilaterally. Sensation is intact to pinprick in the distal lower extremities. Reflexes are symmetrical bilaterally. No pathologic reflexes are present. Patient has a normal gait and stance.   Data Review:CBC    Component Value Date/Time   WBC 6.5 03/07/2015 1408   RBC 4.42 03/07/2015 1408   HGB 14.2 03/07/2015 1408   HCT 40.9 03/07/2015 1408   PLT 193 03/07/2015 1408   MCV 92.5 03/07/2015 1408   MCH 32.1 03/07/2015 1408   MCHC 34.7 03/07/2015 1408   RDW 14.0 03/07/2015 1408                          BMET    Component Value  Date/Time   NA 140 03/07/2015 1408   K 3.6 03/07/2015 1408   CL 103 03/07/2015 1408   CO2 27 03/07/2015 1408   GLUCOSE 215* 03/07/2015 1408   BUN 14 03/07/2015 1408   CREATININE 1.23 03/07/2015 1408   CALCIUM 9.8 03/07/2015 1408   GFRNONAA 56* 03/07/2015 1408   GFRAA >60 03/07/2015 1408     Assessment/Plan: Patient with multilevel multifactorial lumbar stenosis, with resulting neurogenic claudication, is admitted for decompression at  the L3-4 and L4-5 levels, with implantation of a Coflex interspinous device at each level.  I've discussed with the patient the nature of his condition, the nature the surgical procedure, the typical length of surgery, hospital stay, and overall recuperation. We discussed limitations postoperatively. I discussed risks of surgery including risks of infection, bleeding, possibly need for transfusion, the risk of nerve root dysfunction with pain, weakness, numbness, or paresthesias, or risk of dural tear and CSF leakage and possible need for further surgery, the risk of migration of the implanted device, and the risk of anesthetic complications including myocardial infarction, stroke, pneumonia, and death. Understanding all this the patient does wish to proceed with surgery and is admitted for such.    [name],ROBERT W, MD 03/17/2015 7:09 AM

## 2015-03-17 NOTE — Transfer of Care (Signed)
Immediate Anesthesia Transfer of Care Note  Patient: Alan Shields  Procedure(s) Performed: Procedure(s): Laminectomy and Foraminotomy - Lumbar three-Lumbar four - Lumbar four-Lumbar five - bilateral with CoFlex (Bilateral)  Patient Location: PACU  Anesthesia Type:General  Level of Consciousness: awake, alert , oriented and patient cooperative  Airway & Oxygen Therapy: Patient Spontanous Breathing and Patient connected to face mask oxygen  Post-op Assessment: Report given to RN and Post -op Vital signs reviewed and stable  Post vital signs: Reviewed and stable  Last Vitals:  Filed Vitals:   03/17/15 0650  BP: 145/78  Pulse: 81  Temp: 37 C  Resp: 18    Complications: No apparent anesthesia complications

## 2015-03-18 ENCOUNTER — Encounter (HOSPITAL_COMMUNITY): Payer: Self-pay | Admitting: Neurosurgery

## 2015-03-18 LAB — GLUCOSE, CAPILLARY: Glucose-Capillary: 156 mg/dL — ABNORMAL HIGH (ref 65–99)

## 2015-03-18 MED ORDER — HEPARIN SODIUM (PORCINE) 1000 UNIT/ML IJ SOLN
INTRAMUSCULAR | Status: AC
  Filled 2015-03-18: qty 1

## 2015-03-18 MED ORDER — OXYCODONE-ACETAMINOPHEN 5-325 MG PO TABS: 1.0000 | ORAL_TABLET | ORAL | Status: AC | PRN

## 2015-03-18 NOTE — Discharge Summary (Signed)
Physician Discharge Summary  Patient ID: Alan Shields MRN: [id number] DOB/AGE: May 19, 1940 75 y.o.  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 Postoperatively he has had good relief of his neurogenic claudication. He is up and ambulating actively. His wound is clean and dry; there is no erythema, swelling, or drainage. He is being discharged home with instructions regarding wound care and activities. He is going to spend tonight at a local hotel, and then [place] [facility] [place] [facility]. He is scheduled for follow-up with me in 3 weeks.  Discharge Exam: Blood pressure 110/48, pulse 75, temperature 99 F (37.2 C), temperature source Oral, resp. rate 18, height 6\' 1"  (1.854 m), weight 174.3 kg (384 lb 4.2 oz), SpO2 100 %.  Disposition: Home    Medication List    TAKE these medications        cholecalciferol 1000 units tablet  Commonly known as:  VITAMIN D  Take 5,000 Units by mouth daily.     finasteride 5 MG tablet  Commonly known as:  PROSCAR  Take 5 mg by mouth daily.     hydrochlorothiazide 50 MG tablet  Commonly known as:  HYDRODIURIL  Take 50 mg by mouth daily.     HYDROcodone-acetaminophen 7.5-325 MG tablet  Commonly known as:  NORCO  Take 1 tablet by mouth every 4 (four) hours as  needed for moderate pain.     irbesartan 150 MG tablet  Commonly known as:  AVAPRO  Take 150 mg by mouth daily.     nabumetone 500 MG tablet  Commonly known as:  RELAFEN  Take 500 mg by mouth 2 (two) times daily.     oxybutynin 10 MG 24 hr tablet  Commonly known as:  DITROPAN-XL  Take 10 mg by mouth daily.     oxyCODONE-acetaminophen 5-325 MG tablet  Commonly known as:  PERCOCET/ROXICET  Take 1-2 tablets by mouth every 4 (four) hours as needed for moderate pain.     QUEtiapine 50 MG tablet  Commonly known as:  SEROQUEL  Take 50 mg by mouth at bedtime.         SignedHewitt Shorts: NUDELMAN,ROBERT W 03/18/2015, 9:45 AM

## 2015-03-18 NOTE — Discharge Instructions (Signed)

## 2015-03-18 NOTE — Progress Notes (Signed)
Patient alert and oriented, mae's well, voiding adequate amount of urine, swallowing without difficulty, c/o moderate pain and meds given prior to discharged. Patient discharged home with family. Script and discharged instructions given to patient. Patient and family stated understanding of instructions given.  

## 2015-03-18 NOTE — Addendum Note (Signed)
Addendum  created 03/18/15 1826 by Adonis HousekeeperJanna M Guss Farruggia, CRNA   Modules edited: Anesthesia Medication Administration

## 2016-06-06 IMAGING — RF DG C-ARM 61-120 MIN
1 series · 2 of 2 positions shown · non-contrast
Comparison: None.

CLINICAL DATA: Operative imaging for lumbar laminectomy and
posterior fusion.

EXAM:
LUMBAR SPINE - 2-3 VIEW; DG C-ARM 61-120 MIN

[Series 1: run · 2 of 2 slices shown]
[im 1/2]
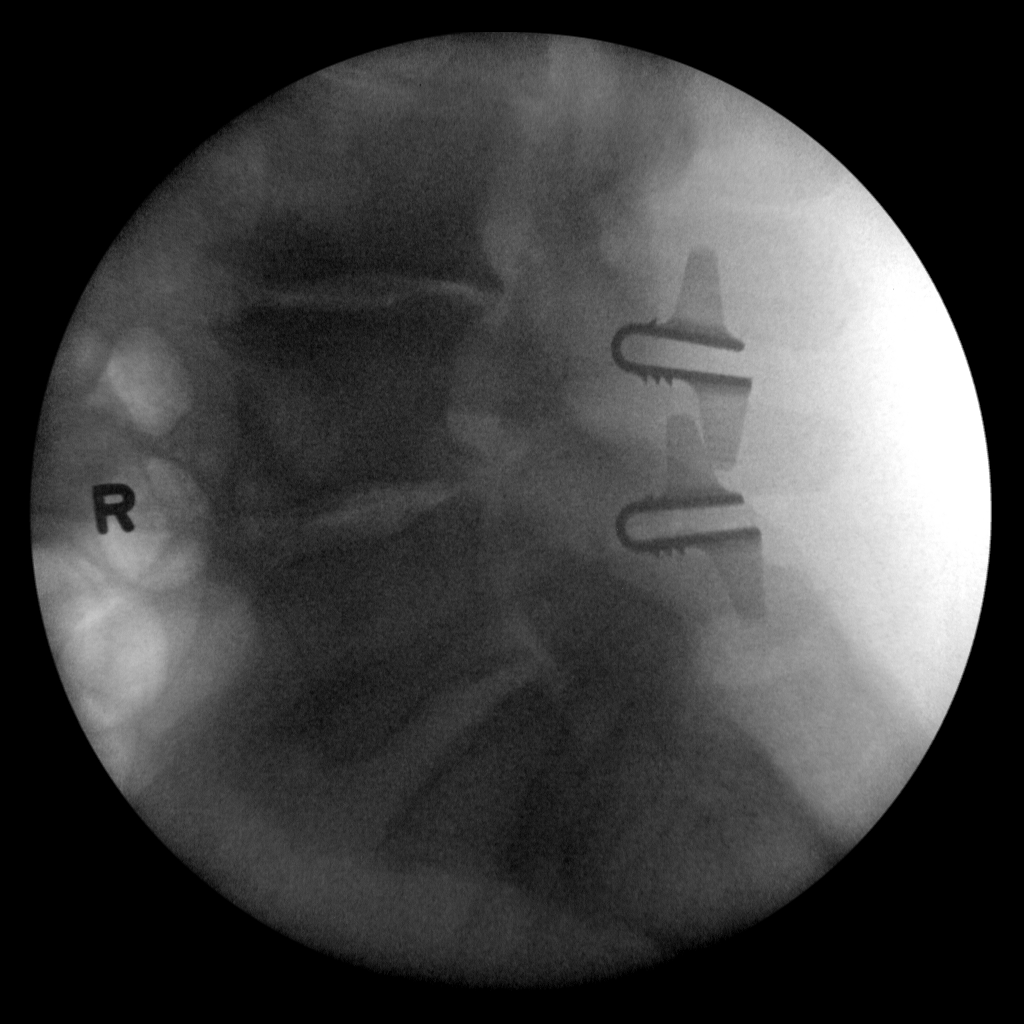
[im 2/2]
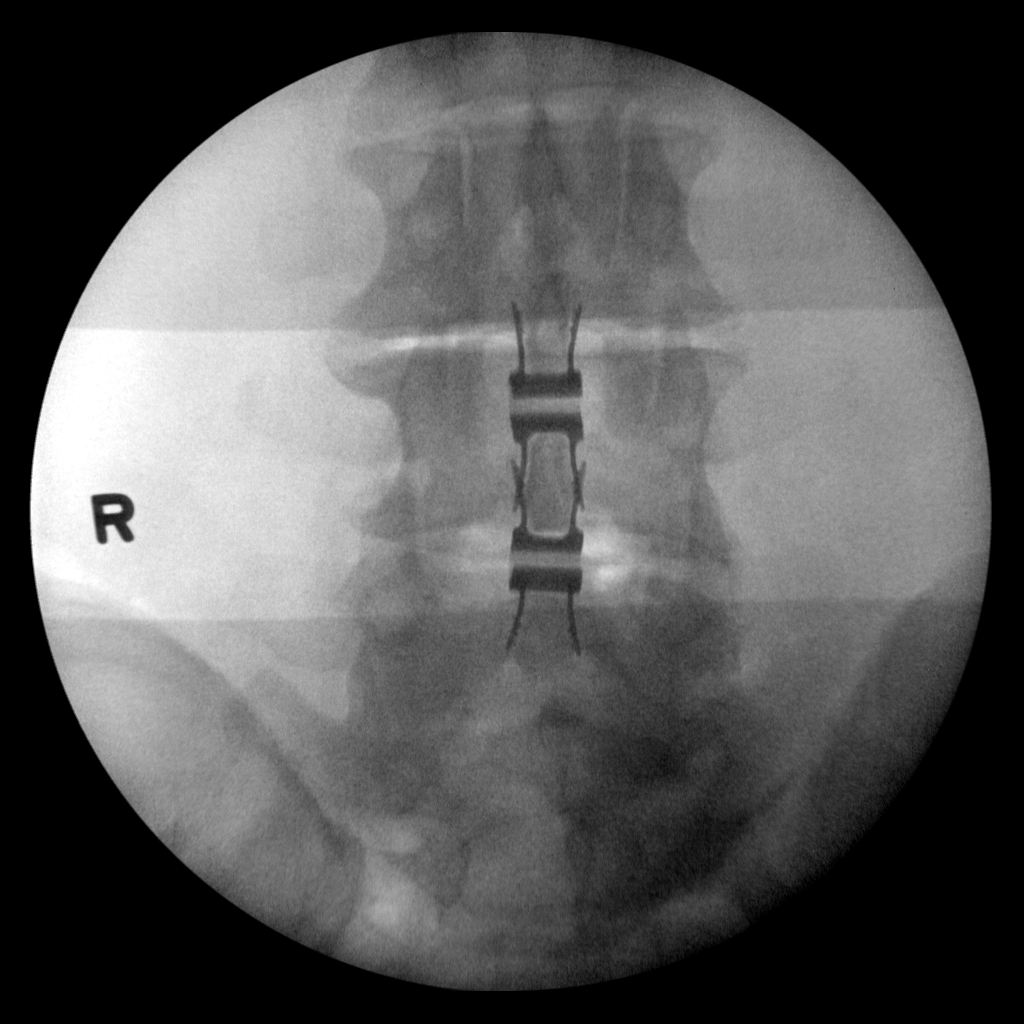

[2 of 2 positions shown; findings below may reference images not displayed]

FINDINGS: The 2 submitted images show placement of 2 Coflex fusion devices
between the spinous processes of L3 and L4 and L4 and L5. Orthopedic
hardware appears well seated and positioned.
IMPRESSION: Imaging from posterior lumbar spine fusion as described.
